# Patient Record
Sex: Female | Born: 1945 | Race: Black or African American | Hispanic: No | Marital: Married | Smoking: Never smoker
Health system: Southern US, Community
[De-identification: ages and names within clinical notes are randomized; demographics above are authoritative.]

## PROBLEM LIST (undated history)

## (undated) HISTORY — PX: COLONOSCOPY: SHX174

## (undated) HISTORY — PX: ABDOMINAL HYSTERECTOMY: SHX81

## (undated) HISTORY — PX: BREAST BIOPSY: SHX20

---

## 1980-03-17 HISTORY — PX: HYSTEROTOMY: SHX1776

## 2010-11-01 ENCOUNTER — Other Ambulatory Visit: Payer: Self-pay | Admitting: Family Medicine

## 2010-11-01 DIAGNOSIS — Z1231 Encounter for screening mammogram for malignant neoplasm of breast: Secondary | ICD-10-CM

## 2010-11-01 DIAGNOSIS — Z780 Asymptomatic menopausal state: Secondary | ICD-10-CM

## 2010-11-08 ENCOUNTER — Other Ambulatory Visit: Payer: Self-pay

## 2010-11-08 ENCOUNTER — Ambulatory Visit: Payer: Self-pay

## 2010-12-06 ENCOUNTER — Ambulatory Visit
Admission: RE | Admit: 2010-12-06 | Discharge: 2010-12-06 | Disposition: A | Discharge status: Home / Self Care | Payer: PRIVATE HEALTH INSURANCE | Source: Ambulatory Visit | Attending: Family Medicine | Admitting: Family Medicine

## 2010-12-06 DIAGNOSIS — Z780 Asymptomatic menopausal state: Secondary | ICD-10-CM

## 2010-12-06 DIAGNOSIS — Z1231 Encounter for screening mammogram for malignant neoplasm of breast: Secondary | ICD-10-CM

## 2012-01-08 ENCOUNTER — Other Ambulatory Visit: Payer: Self-pay | Admitting: Family Medicine

## 2012-01-08 DIAGNOSIS — Z1231 Encounter for screening mammogram for malignant neoplasm of breast: Secondary | ICD-10-CM

## 2012-01-20 ENCOUNTER — Ambulatory Visit
Admission: RE | Admit: 2012-01-20 | Discharge: 2012-01-20 | Disposition: A | Discharge status: Home / Self Care | Payer: PRIVATE HEALTH INSURANCE | Source: Ambulatory Visit | Attending: Family Medicine | Admitting: Family Medicine

## 2012-01-20 DIAGNOSIS — Z1231 Encounter for screening mammogram for malignant neoplasm of breast: Secondary | ICD-10-CM

## 2013-02-09 ENCOUNTER — Other Ambulatory Visit: Payer: Self-pay

## 2013-02-09 DIAGNOSIS — Z1231 Encounter for screening mammogram for malignant neoplasm of breast: Secondary | ICD-10-CM

## 2013-03-18 ENCOUNTER — Ambulatory Visit
Admission: RE | Admit: 2013-03-18 | Discharge: 2013-03-18 | Disposition: A | Discharge status: Home / Self Care | Payer: PRIVATE HEALTH INSURANCE | Source: Ambulatory Visit

## 2013-03-18 DIAGNOSIS — Z1231 Encounter for screening mammogram for malignant neoplasm of breast: Secondary | ICD-10-CM

## 2014-05-02 ENCOUNTER — Other Ambulatory Visit: Payer: Self-pay

## 2014-05-02 DIAGNOSIS — Z1231 Encounter for screening mammogram for malignant neoplasm of breast: Secondary | ICD-10-CM

## 2014-05-05 ENCOUNTER — Ambulatory Visit
Admission: RE | Admit: 2014-05-05 | Discharge: 2014-05-05 | Disposition: A | Discharge status: Home / Self Care | Payer: Medicare Other | Source: Ambulatory Visit

## 2014-05-05 DIAGNOSIS — Z1231 Encounter for screening mammogram for malignant neoplasm of breast: Secondary | ICD-10-CM

## 2015-04-26 ENCOUNTER — Other Ambulatory Visit: Payer: Self-pay

## 2015-04-26 DIAGNOSIS — Z1231 Encounter for screening mammogram for malignant neoplasm of breast: Secondary | ICD-10-CM

## 2015-05-08 ENCOUNTER — Ambulatory Visit
Admission: RE | Admit: 2015-05-08 | Discharge: 2015-05-08 | Disposition: A | Discharge status: Home / Self Care | Payer: Medicare Other | Source: Ambulatory Visit

## 2015-05-08 DIAGNOSIS — Z1231 Encounter for screening mammogram for malignant neoplasm of breast: Secondary | ICD-10-CM

## 2016-05-06 ENCOUNTER — Other Ambulatory Visit: Payer: Self-pay | Admitting: Physician Assistant

## 2016-05-06 DIAGNOSIS — R5381 Other malaise: Secondary | ICD-10-CM

## 2016-05-08 ENCOUNTER — Other Ambulatory Visit: Payer: Self-pay | Admitting: Family Medicine

## 2016-05-08 ENCOUNTER — Other Ambulatory Visit: Payer: Self-pay | Admitting: Physician Assistant

## 2016-05-08 DIAGNOSIS — Z1231 Encounter for screening mammogram for malignant neoplasm of breast: Secondary | ICD-10-CM

## 2016-05-08 DIAGNOSIS — E2839 Other primary ovarian failure: Secondary | ICD-10-CM

## 2016-05-16 ENCOUNTER — Ambulatory Visit
Admission: RE | Admit: 2016-05-16 | Discharge: 2016-05-16 | Disposition: A | Discharge status: Home / Self Care | Payer: Medicare Other | Source: Ambulatory Visit | Attending: Family Medicine | Admitting: Family Medicine

## 2016-05-16 ENCOUNTER — Ambulatory Visit
Admission: RE | Admit: 2016-05-16 | Discharge: 2016-05-16 | Disposition: A | Discharge status: Home / Self Care | Payer: Medicare Other | Source: Ambulatory Visit | Attending: Physician Assistant | Admitting: Physician Assistant

## 2016-05-16 DIAGNOSIS — Z1231 Encounter for screening mammogram for malignant neoplasm of breast: Secondary | ICD-10-CM

## 2016-05-16 DIAGNOSIS — E2839 Other primary ovarian failure: Secondary | ICD-10-CM

## 2016-05-23 ENCOUNTER — Other Ambulatory Visit: Payer: Medicare Other

## 2016-05-23 ENCOUNTER — Ambulatory Visit: Payer: Medicare Other

## 2017-05-20 ENCOUNTER — Other Ambulatory Visit: Payer: Self-pay | Admitting: Family Medicine

## 2017-05-20 DIAGNOSIS — Z1231 Encounter for screening mammogram for malignant neoplasm of breast: Secondary | ICD-10-CM

## 2017-06-05 ENCOUNTER — Ambulatory Visit
Admission: RE | Admit: 2017-06-05 | Discharge: 2017-06-05 | Disposition: A | Discharge status: Home / Self Care | Payer: PRIVATE HEALTH INSURANCE | Source: Ambulatory Visit | Attending: Family Medicine | Admitting: Family Medicine

## 2017-06-05 DIAGNOSIS — Z1231 Encounter for screening mammogram for malignant neoplasm of breast: Secondary | ICD-10-CM

## 2017-09-28 ENCOUNTER — Encounter: Payer: Self-pay | Admitting: Family Medicine

## 2018-07-14 ENCOUNTER — Other Ambulatory Visit: Payer: Self-pay | Admitting: Family Medicine

## 2018-07-14 DIAGNOSIS — Z1231 Encounter for screening mammogram for malignant neoplasm of breast: Secondary | ICD-10-CM

## 2018-09-08 ENCOUNTER — Ambulatory Visit
Admission: RE | Admit: 2018-09-08 | Discharge: 2018-09-08 | Disposition: A | Discharge status: Home / Self Care | Payer: PRIVATE HEALTH INSURANCE | Source: Ambulatory Visit | Attending: Family Medicine | Admitting: Family Medicine

## 2018-09-08 DIAGNOSIS — Z1231 Encounter for screening mammogram for malignant neoplasm of breast: Secondary | ICD-10-CM

## 2019-08-10 ENCOUNTER — Other Ambulatory Visit: Payer: Self-pay | Admitting: Family Medicine

## 2019-08-10 DIAGNOSIS — Z1231 Encounter for screening mammogram for malignant neoplasm of breast: Secondary | ICD-10-CM

## 2019-09-09 ENCOUNTER — Other Ambulatory Visit: Payer: Self-pay

## 2019-09-09 ENCOUNTER — Ambulatory Visit
Admission: RE | Admit: 2019-09-09 | Discharge: 2019-09-09 | Disposition: A | Discharge status: Home / Self Care | Payer: Medicare Other | Source: Ambulatory Visit | Attending: Family Medicine | Admitting: Family Medicine

## 2019-09-09 DIAGNOSIS — Z1231 Encounter for screening mammogram for malignant neoplasm of breast: Secondary | ICD-10-CM

## 2020-08-03 ENCOUNTER — Other Ambulatory Visit: Payer: Self-pay | Admitting: Family Medicine

## 2020-08-03 DIAGNOSIS — Z1231 Encounter for screening mammogram for malignant neoplasm of breast: Secondary | ICD-10-CM

## 2020-09-07 ENCOUNTER — Encounter: Payer: Self-pay | Admitting: Gastroenterology

## 2020-10-01 ENCOUNTER — Other Ambulatory Visit: Payer: Self-pay

## 2020-10-01 ENCOUNTER — Ambulatory Visit
Admission: RE | Admit: 2020-10-01 | Discharge: 2020-10-01 | Disposition: A | Discharge status: Home / Self Care | Payer: Medicare Other | Source: Ambulatory Visit | Attending: Family Medicine | Admitting: Family Medicine

## 2020-10-01 DIAGNOSIS — Z1231 Encounter for screening mammogram for malignant neoplasm of breast: Secondary | ICD-10-CM

## 2020-10-05 ENCOUNTER — Other Ambulatory Visit: Payer: Self-pay

## 2020-10-05 ENCOUNTER — Ambulatory Visit (AMBULATORY_SURGERY_CENTER): Payer: Self-pay

## 2020-10-05 VITALS — Ht 63.0 in | Wt 189.0 lb

## 2020-10-05 DIAGNOSIS — Z1211 Encounter for screening for malignant neoplasm of colon: Secondary | ICD-10-CM

## 2020-10-05 MED ORDER — PLENVU 140 G PO SOLR: 1.0000 | Freq: Once | ORAL | 0 refills | Status: AC

## 2020-10-05 NOTE — Progress Notes (Signed)
No egg or soy allergy known to patient  No issues with past sedation with any surgeries or procedures Patient denies ever being told they had issues or difficulty with intubation  No FH of Malignant Hyperthermia No diet pills per patient No home 02 use per patient  No blood thinners per patient  Pt denies issues with constipation  No A fib or A flutter  EMMI video to pt or via MyChart  COVID 19 guidelines implemented in PV today with Pt and RN  Pt is fully vaccinated  for Covid   plenvu Coupon given to pt in PV today , Code to Pharmacy and  NO PA's for preps discussed with pt In PV today  Discussed with pt there will be an out-of-pocket cost for prep and that varies from $0 to 70 dollars   Due to the COVID-19 pandemic we are asking patients to follow certain guidelines.  Pt aware of COVID protocols and [facility] guidelines   

## 2020-10-26 ENCOUNTER — Encounter: Payer: Medicare Other | Admitting: Gastroenterology

## 2020-12-14 ENCOUNTER — Encounter: Payer: Self-pay | Admitting: Gastroenterology

## 2020-12-14 ENCOUNTER — Other Ambulatory Visit: Payer: Self-pay

## 2020-12-14 ENCOUNTER — Ambulatory Visit (AMBULATORY_SURGERY_CENTER): Payer: Medicare Other | Admitting: Gastroenterology

## 2020-12-14 VITALS — BP 142/79 | HR 81 | Temp 97.7°F | Resp 13 | Ht 63.0 in | Wt 189.0 lb

## 2020-12-14 DIAGNOSIS — Z1211 Encounter for screening for malignant neoplasm of colon: Secondary | ICD-10-CM | POA: Diagnosis not present

## 2020-12-14 MED ORDER — SODIUM CHLORIDE 0.9 % IV SOLN: 500.0000 mL | Freq: Once | INTRAVENOUS | Status: AC

## 2020-12-14 NOTE — Op Note (Signed)
[facility] Patient Name: Lori Gaines Procedure Date: 12/14/2020 11:46 AM MRN: [id number] Endoscopist: Sherilyn Cooter L. Myrtie Neither , MD Age: 75 Referring MD:  Date of Birth: 12-28-1945 Gender: Female Account #: [id number] Procedure:                Colonoscopy Indications:              Screening for colorectal malignant neoplasm                           Patient reports no polyps last colonoscopy > 10                            years ago in [place] Medicines:                Monitored Anesthesia Care Procedure:                Pre-Anesthesia Assessment:                           - Prior to the procedure, a History and Physical                            was performed, and patient medications and                            allergies were reviewed. The patient's tolerance of                            previous anesthesia was also reviewed. The risks                            and benefits of the procedure and the sedation                            options and risks were discussed with the patient.                            All questions were answered, and informed consent                            was obtained. Prior Anticoagulants: The patient has                            taken no previous anticoagulant or antiplatelet                            agents. ASA Grade Assessment: II - A patient with                            mild systemic disease. After reviewing the risks                            and benefits, the patient was deemed in  satisfactory condition to undergo the procedure.                           After obtaining informed consent, the colonoscope                            was passed under direct vision. Throughout the                            procedure, the patient's blood pressure, pulse, and                            oxygen saturations were monitored continuously. The                            CF [id number] #[id number] was introduced  through the anus                            and advanced to the the terminal ileum, with                            identification of the appendiceal orifice and IC                            valve. The colonoscopy was somewhat difficult due                            to a redundant colon and a tortuous colon.                            Successful completion of the procedure was aided by                            using manual pressure and straightening and                            shortening the scope to obtain bowel loop                            reduction. The patient tolerated the procedure                            well. The quality of the bowel preparation was                            good. The terminal ileum, ileocecal valve,                            appendiceal orifice, and rectum were photographed. Scope In: 11:55:59 AM Scope Out: 12:12:35 PM Scope Withdrawal Time: 0 hours 10 minutes 41 seconds  Total Procedure Duration: 0 hours 16 minutes 36 seconds  Findings:                 The perianal and digital rectal examinations  were                            normal.                           A few diverticula were found in the left colon.                           The sigmoid colon was redundant and tortuous.                           The exam was otherwise without abnormality on                            direct and retroflexion views. Complications:            No immediate complications. Estimated Blood Loss:     Estimated blood loss: none. Impression:               - Diverticulosis in the left colon.                           - Redundant colon.                           - The examination was otherwise normal on direct                            and retroflexion views.                           - No specimens collected. Recommendation:           - Patient has a contact number available for                            emergencies. The signs and symptoms of potential                             delayed complications were discussed with the                            patient. Return to normal activities tomorrow.                            Written discharge instructions were provided to the                            patient.                           - Resume previous diet.                           - Continue present medications.                           -  No repeat screening colonoscopy recommended due                            to age, today's findings and current guidelines. Videl Nobrega L. Myrtie Neither, MD 12/14/2020 12:18:12 PM This report has been signed electronically.

## 2020-12-14 NOTE — Progress Notes (Signed)
To PACU, VSS. Report to [email or web address] 

## 2020-12-14 NOTE — Patient Instructions (Signed)
Resume previous diet and continue current medications. No repeat Colonoscopy needed due to age and current guidelines.  YOU HAD AN ENDOSCOPIC PROCEDURE TODAY AT THE [facility]:   Refer to the procedure report that was given to you for any specific questions about what was found during the examination.  If the procedure report does not answer your questions, please call your gastroenterologist to clarify.  If you requested that your care partner not be given the details of your procedure findings, then the procedure report has been included in a sealed envelope for you to review at your convenience later.  YOU SHOULD EXPECT: Some feelings of bloating in the abdomen. Passage of more gas than usual.  Walking can help get rid of the air that was put into your GI tract during the procedure and reduce the bloating. If you had a lower endoscopy (such as a colonoscopy or flexible sigmoidoscopy) you may notice spotting of blood in your stool or on the toilet paper. If you underwent a bowel prep for your procedure, you may not have a normal bowel movement for a few days.  Please Note:  You might notice some irritation and congestion in your nose or some drainage.  This is from the oxygen used during your procedure.  There is no need for concern and it should clear up in a day or so.  SYMPTOMS TO REPORT IMMEDIATELY:  Following lower endoscopy (colonoscopy or flexible sigmoidoscopy):  Excessive amounts of blood in the stool  Significant tenderness or worsening of abdominal pains  Swelling of the abdomen that is new, acute  Fever of 100F or higher  For urgent or emergent issues, a gastroenterologist can be reached at any hour by calling [phone]. Do not use MyChart messaging for urgent concerns.    DIET:  We do recommend a small meal at first, but then you may proceed to your regular diet.  Drink plenty of fluids but you should avoid alcoholic beverages for 24 hours.  ACTIVITY:  You  should plan to take it easy for the rest of today and you should NOT DRIVE or use heavy machinery until tomorrow (because of the sedation medicines used during the test).    FOLLOW UP: Our staff will call the number listed on your records 48-72 hours following your procedure to check on you and address any questions or concerns that you may have regarding the information given to you following your procedure. If we do not reach you, we will leave a message.  We will attempt to reach you two times.  During this call, we will ask if you have developed any symptoms of COVID 19. If you develop any symptoms (ie: fever, flu-like symptoms, shortness of breath, cough etc.) before then, please call [phone].  If you test positive for Covid 19 in the 2 weeks post procedure, please call and report this information to [place].    If any biopsies were taken you will be contacted by phone or by letter within the next 1-3 weeks.  Please call us at [phone] if you have not heard about the biopsies in 3 weeks.    SIGNATURES/CONFIDENTIALITY: You and/or your care partner have signed paperwork which will be entered into your electronic medical record.  These signatures attest to the fact that that the information above on your After Visit Summary has been reviewed and is understood.  Full responsibility of the confidentiality of this discharge information lies with you and/or your care-partner.

## 2020-12-14 NOTE — Progress Notes (Signed)
CW- VS  Pt's states no medical or surgical changes since previsit or office visit.  

## 2020-12-14 NOTE — Progress Notes (Signed)
History and Physical:  This patient presents for endoscopic testing for: Encounter Diagnosis  Name Primary?   Special screening for malignant neoplasms, colon Yes    Patient without complaints today. Screening colonoscopy. Had one > 10 years ago in [place]    Past Medical History: HTN   Past Surgical History: Past Surgical History:  Procedure Laterality Date   COLONOSCOPY     2022-states years ago in [place] (was normal)   HYSTEROTOMY  1982    Allergies: Allergies  Allergen Reactions   Prednisone Other (See Comments)    States felt like she was "floating around and in a daze"   Clarithromycin Anxiety   Penicillins Rash    Outpatient Meds: Current Outpatient Medications  Medication Sig Dispense Refill   lisinopril-hydrochlorothiazide (ZESTORETIC) 10-12.5 MG tablet Take 1 tablet by mouth daily.     Multiple Vitamin (MULTIVITAMIN) tablet Take 1 tablet by mouth daily.     loratadine-pseudoephedrine (CLARITIN-D 12-HOUR) 5-120 MG tablet Take 1 tablet by mouth as needed.     tretinoin (RETIN-A) 0.1 % cream Apply 1 application topically at bedtime.     Current Facility-Administered Medications  Medication Dose Route Frequency Provider Last Rate Last Admin   0.9 %  sodium chloride infusion  500 mL Intravenous Once Charlie Pitter III, MD          ___________________________________________________________________ Objective   Exam:  BP 140/74   Pulse 88   Temp 97.7 F (36.5 C)   Ht 5\' 3"  (1.6 m)   Wt 189 lb (85.7 kg)   SpO2 100%   BMI 33.48 kg/m   CV: RRR without murmur, S1/S2 Resp: clear to auscultation bilaterally, normal RR and effort noted GI: soft, no tenderness, with active bowel sounds.   Assessment: Encounter Diagnosis  Name Primary?   Special screening for malignant neoplasms, colon Yes     Plan: Colonoscopy  The benefits and risks of the planned procedure were described in detail with the patient or (when appropriate) their health care  proxy.  Risks were outlined as including, but not limited to, bleeding, infection, perforation, adverse medication reaction leading to cardiac or pulmonary decompensation, pancreatitis (if ERCP).  The limitation of incomplete mucosal visualization was also discussed.  No guarantees or warranties were given.    The patient is appropriate for an endoscopic procedure in the ambulatory setting.   - , MD

## 2020-12-18 ENCOUNTER — Telehealth: Payer: Self-pay | Admitting: *Deleted

## 2020-12-18 NOTE — Telephone Encounter (Signed)
  Follow up Call-  Call back number 12/14/2020  Post procedure Call Back phone  # [phone]  Permission to leave phone message Yes  Some recent data might be hidden     Patient questions:  Do you have a fever, pain , or abdominal swelling? No. Pain Score  0 *  Have you tolerated food without any problems? Yes.    Have you been able to return to your normal activities? Yes.    Do you have any questions about your discharge instructions: Diet   No. Medications  No. Follow up visit  No.  Do you have questions or concerns about your Care? No.  Actions: * If pain score is 4 or above: No action needed, pain <4.  Have you developed a fever since your procedure? no  2.   Have you had an respiratory symptoms (SOB or cough) since your procedure? no  3.   Have you tested positive for COVID 19 since your procedure no  4.   Have you had any family members/close contacts diagnosed with the COVID 19 since your procedure?  no   If yes to any of these questions please route to Laverna Peace, RN and Karlton Lemon, RN

## 2021-09-18 ENCOUNTER — Other Ambulatory Visit: Payer: Self-pay | Admitting: Family Medicine

## 2021-09-18 DIAGNOSIS — Z1231 Encounter for screening mammogram for malignant neoplasm of breast: Secondary | ICD-10-CM

## 2021-10-04 ENCOUNTER — Ambulatory Visit
Admission: RE | Admit: 2021-10-04 | Discharge: 2021-10-04 | Disposition: A | Discharge status: Home / Self Care | Payer: Medicare Other | Source: Ambulatory Visit | Attending: Family Medicine | Admitting: Family Medicine

## 2021-10-04 DIAGNOSIS — Z1231 Encounter for screening mammogram for malignant neoplasm of breast: Secondary | ICD-10-CM

## 2022-03-05 IMAGING — MG MM DIGITAL SCREENING BILAT W/ TOMO AND CAD
8 series · 8 of 24 positions shown · non-contrast
Comparison: Previous exam(s).

CLINICAL DATA: Screening.

EXAM:
DIGITAL SCREENING BILATERAL MAMMOGRAM WITH TOMOSYNTHESIS AND CAD
TECHNIQUE: Bilateral screening digital craniocaudal and mediolateral oblique
mammograms were obtained. Bilateral screening digital breast
tomosynthesis was performed. The images were evaluated with
computer-aided detection.

[L MLO synth-2D]
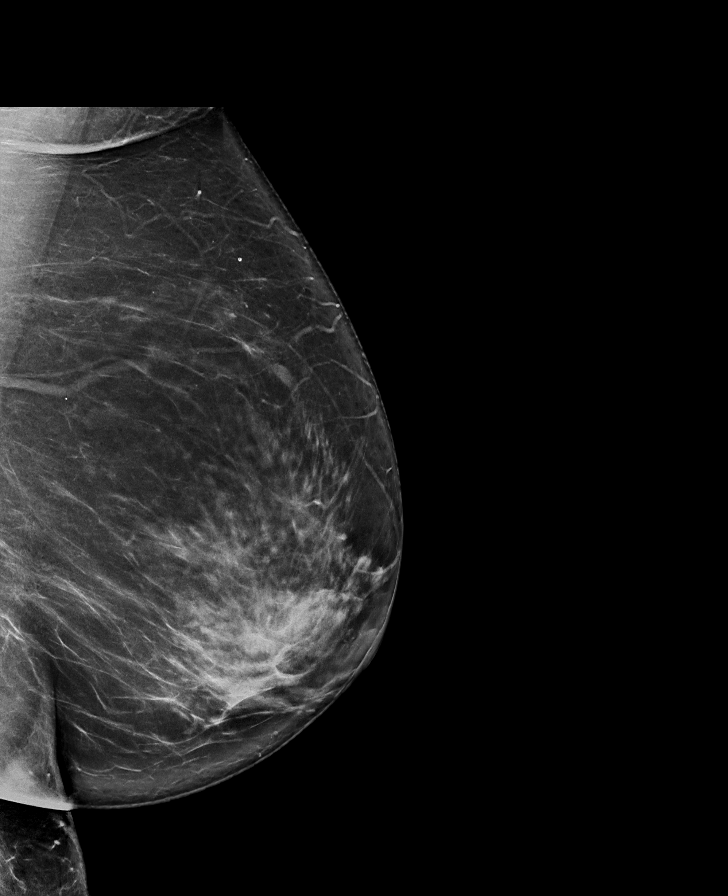

[R MLO synth-2D]
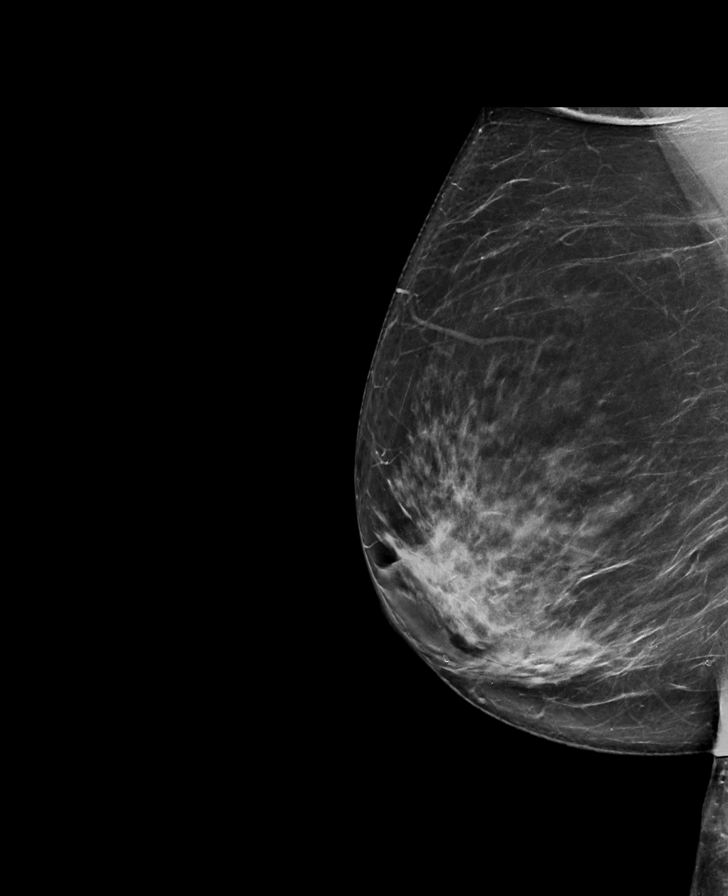

[R CC synth-2D]
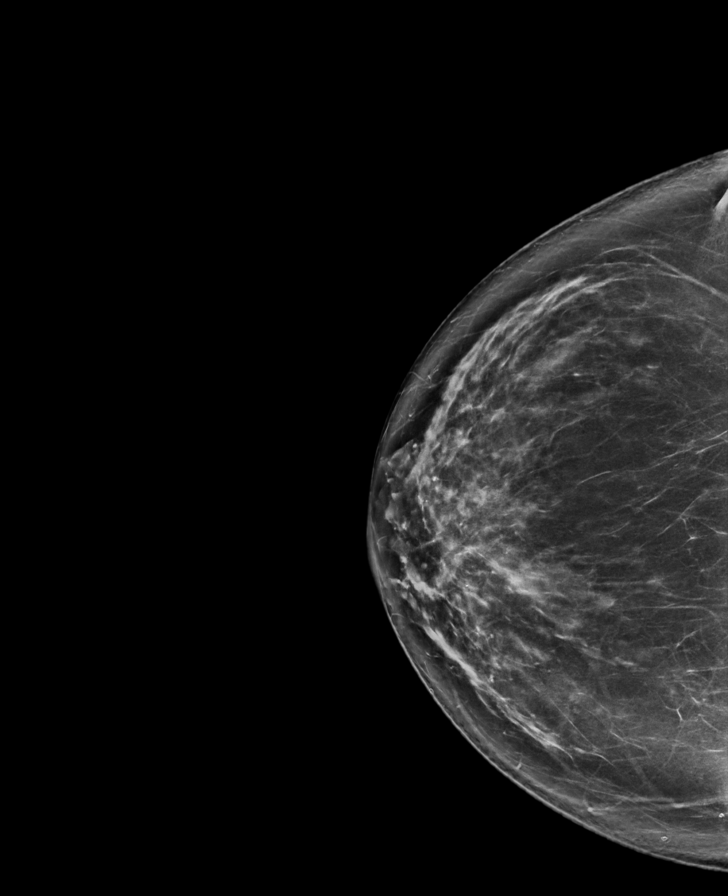

[L CC synth-2D]
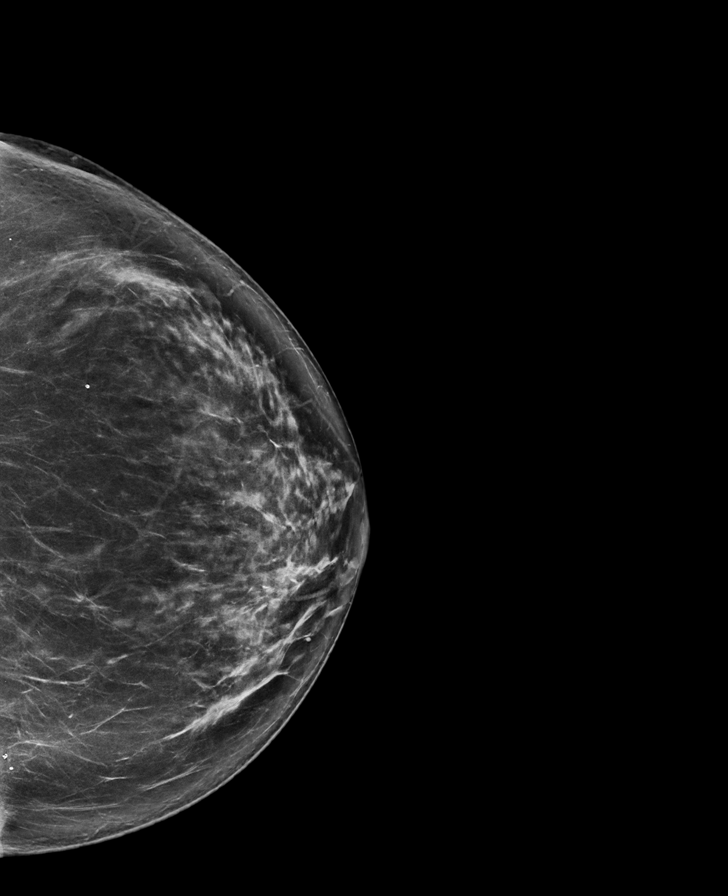

[L MLO tomo · tomo slice 45/90.0]
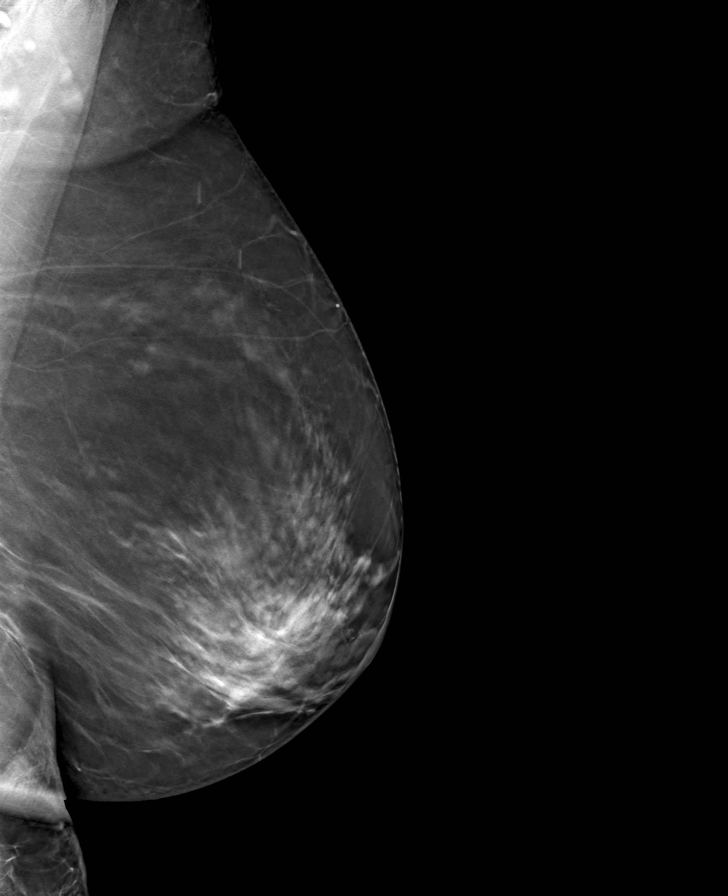

[R CC tomo · tomo slice 45/88.0]
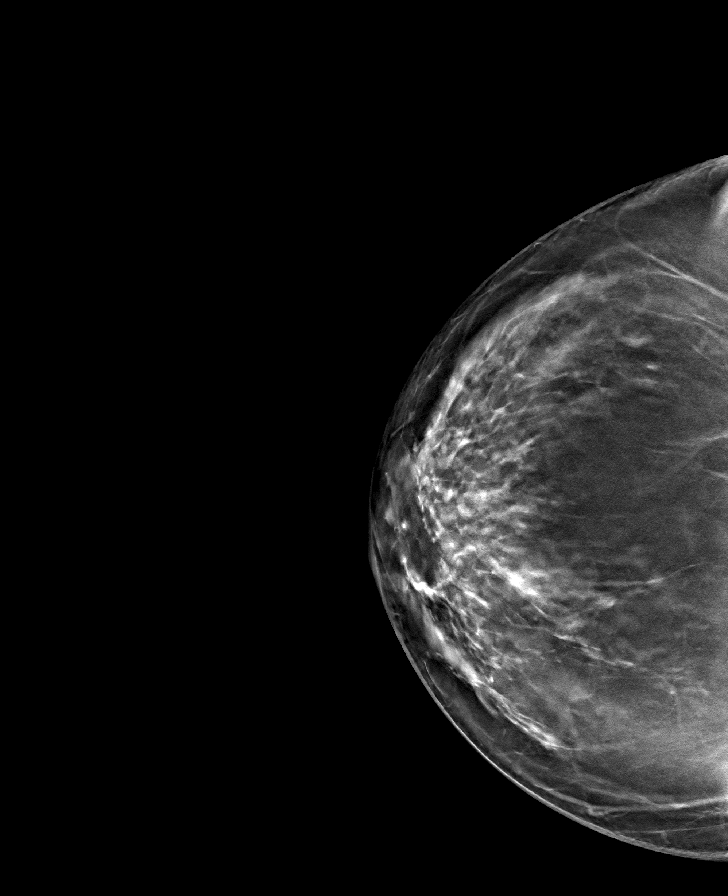

[R MLO tomo · tomo slice 45/90.0]
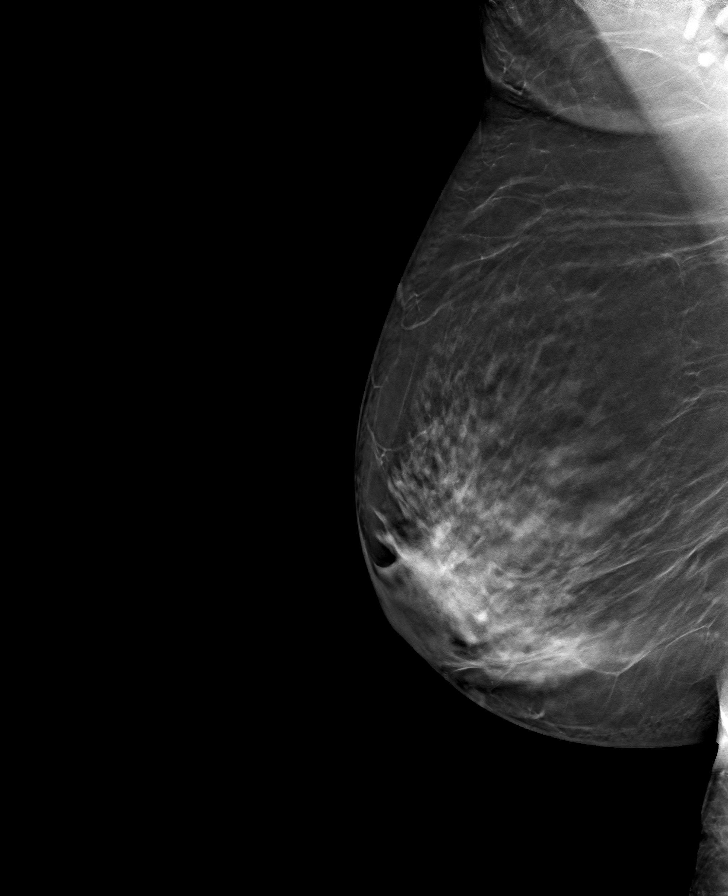

[L CC tomo · tomo slice 42/83.0]
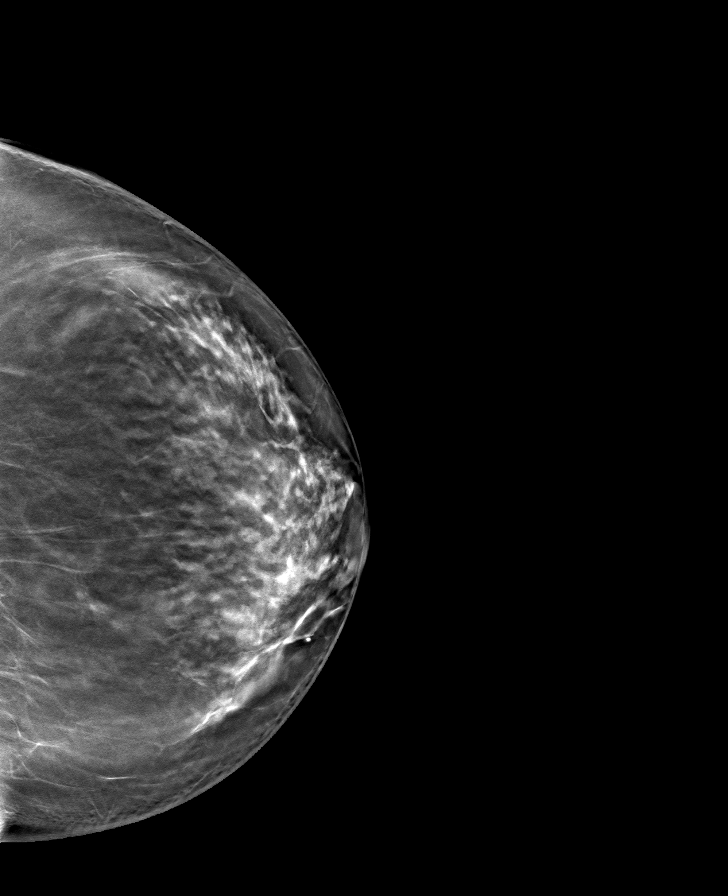

[8 of 24 positions shown; findings below may reference images not displayed]

ACR Breast Density Category b: There are scattered areas of
fibroglandular density.
FINDINGS: There are no findings suspicious for malignancy.
IMPRESSION: No mammographic evidence of malignancy. A result letter of this
screening mammogram will be mailed directly to the patient.

RECOMMENDATION:
Screening mammogram in one year. (Code:51-O-LD2)

BI-RADS CATEGORY  1: Negative.

## 2023-01-26 ENCOUNTER — Other Ambulatory Visit: Payer: Self-pay | Admitting: Family Medicine

## 2023-01-26 DIAGNOSIS — Z1231 Encounter for screening mammogram for malignant neoplasm of breast: Secondary | ICD-10-CM

## 2023-01-27 ENCOUNTER — Ambulatory Visit: Payer: Medicare Other

## 2023-02-19 ENCOUNTER — Ambulatory Visit
Admission: RE | Admit: 2023-02-19 | Discharge: 2023-02-19 | Disposition: A | Discharge status: Home / Self Care | Payer: Medicare Other | Source: Ambulatory Visit | Attending: Family Medicine | Admitting: Family Medicine

## 2023-02-19 DIAGNOSIS — Z1231 Encounter for screening mammogram for malignant neoplasm of breast: Secondary | ICD-10-CM
